# Patient Record
Sex: Female | Born: 1995 | Race: White | Hispanic: No | Marital: Single | Smoking: Never smoker
Health system: Southern US, Community
[De-identification: ages and names within clinical notes are randomized; demographics above are authoritative.]

## PROBLEM LIST (undated history)

## (undated) DIAGNOSIS — Z87898 Personal history of other specified conditions: Secondary | ICD-10-CM

## (undated) HISTORY — DX: Personal history of other specified conditions: Z87.898

---
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
## 2013-01-27 ENCOUNTER — Emergency Department: Payer: Self-pay | Admitting: Unknown Physician Specialty

## 2013-10-10 ENCOUNTER — Encounter: Payer: Self-pay | Admitting: Family Medicine

## 2013-10-10 ENCOUNTER — Other Ambulatory Visit: Payer: Self-pay | Admitting: *Deleted

## 2013-10-10 ENCOUNTER — Ambulatory Visit (INDEPENDENT_AMBULATORY_CARE_PROVIDER_SITE_OTHER): Payer: Managed Care, Other (non HMO) | Admitting: Family Medicine

## 2013-10-10 VITALS — BP 104/70 | HR 57 | Temp 98.2°F | Ht 70.0 in | Wt 157.8 lb

## 2013-10-10 DIAGNOSIS — Z23 Encounter for immunization: Secondary | ICD-10-CM

## 2013-10-10 DIAGNOSIS — R5381 Other malaise: Secondary | ICD-10-CM

## 2013-10-10 DIAGNOSIS — M2440 Recurrent dislocation, unspecified joint: Secondary | ICD-10-CM | POA: Insufficient documentation

## 2013-10-10 DIAGNOSIS — L659 Nonscarring hair loss, unspecified: Secondary | ICD-10-CM | POA: Insufficient documentation

## 2013-10-10 DIAGNOSIS — R5383 Other fatigue: Secondary | ICD-10-CM

## 2013-10-10 LAB — CBC WITH DIFFERENTIAL/PLATELET
Basophils Relative: 1 % (ref 0–1)
Eosinophils Relative: 2 % (ref 0–5)
HCT: 39.7 % (ref 36.0–46.0)
Hemoglobin: 13.7 g/dL (ref 12.0–15.0)
Lymphocytes Relative: 34 % (ref 12–46)
MCHC: 34.5 g/dL (ref 30.0–36.0)
MCV: 87.4 fL (ref 78.0–100.0)
Monocytes Absolute: 0.7 10*3/uL (ref 0.1–1.0)
Monocytes Relative: 9 % (ref 3–12)
Neutro Abs: 4 10*3/uL (ref 1.7–7.7)
RBC: 4.54 MIL/uL (ref 3.87–5.11)
RDW: 13.8 % (ref 11.5–15.5)

## 2013-10-10 LAB — COMPREHENSIVE METABOLIC PANEL
ALT: 13 U/L (ref 0–35)
AST: 18 U/L (ref 0–37)
BUN: 13 mg/dL (ref 6–23)
Calcium: 9.8 mg/dL (ref 8.4–10.5)
Chloride: 104 mEq/L (ref 96–112)
Creat: 0.8 mg/dL (ref 0.50–1.10)
Glucose, Bld: 83 mg/dL (ref 70–99)
Total Bilirubin: 0.4 mg/dL (ref 0.3–1.2)

## 2013-10-10 NOTE — Progress Notes (Signed)
Pre-visit discussion using our clinic review tool. No additional management support is needed unless otherwise documented below in the visit note.  

## 2013-10-10 NOTE — Patient Instructions (Signed)
Flu vaccine today  Here is some info on the HPV vaccine  Labs today for hair loss and fatigue Please send for last 2 office notes form Dr Brickner([facility] sport [facility]) Also Dr Wallace Cullens [facility] rheumatology)  Stay fit and eat a healthy diet

## 2013-10-10 NOTE — Progress Notes (Signed)
Subjective:    Patient ID: Carrie Daugherty, female    DOB: [birth year], 17 y.o.   MRN: [id number]  HPI Here to establish - goes to [employer]  She is interested in biopschych   Used to go to Dr Bard [facility]   Td 06  Thinks she is up to date on other shots  Has not had HPV vaccine   Is very healthy in general   ? If she could poss have a connective tissue disease  She has very "loose joints"  Also her hands never form callouses like they should (and she used to row crew)   Lost a lot of hair (is thinning )- since middle of the summer  Hair is not as thick as it used to be  No family hx of female hair loss  Has been more tired lately-even when sleeping well    (is also in exam time)   Is having regular menses  She lost a lot of muscle when she stopped rowing She was having joint dislocations - shoulders/ wrists Not overly flexible  She was referred to a rheumatoid specialist -did not think she had marfan's or "EDS" Did not do labs there    She likes to run a lot now - about 1-2 hours per day  Does not lift weights   Her scars tend to be large but not keloid   No family hx of any of this   She is a frequent headache sufferer- spring and fall - ? All related    There are no active problems to display for this patient.  Past Medical History  Diagnosis Date  . History of headache    No past surgical history on file. History  Substance Use Topics  . Smoking status: Never Smoker   . Smokeless tobacco: Not on file  . Alcohol Use: No   Family History  Problem Relation Age of Onset  . Alcohol abuse Other     paternal great grandfather  . Arthritis Father   . Arthritis Paternal Grandfather   . Heart disease Paternal Grandmother   . High blood pressure Mother   . High blood pressure Maternal Grandfather   . High blood pressure Maternal Grandmother    No Known Allergies No current outpatient prescriptions on file prior to visit.   No current facility-administered  medications on file prior to visit.     Review of Systems Review of Systems  Constitutional: Negative for fever, appetite change, fatigue and unexpected weight change.  Eyes: Negative for pain and visual disturbance.  Respiratory: Negative for cough and shortness of breath.   Cardiovascular: Negative for cp or palpitations    Gastrointestinal: Negative for nausea, diarrhea and constipation.  Genitourinary: Negative for urgency and frequency.  Skin: Negative for pallor or rash   MSK pos for occasional joint pain without swelling / pos for lax joints  Neurological: Negative for weakness, light-headedness, numbness and headaches.  Hematological: Negative for adenopathy. Does not bruise/bleed easily.  Psychiatric/Behavioral: Negative for dysphoric mood. The patient is not nervous/anxious.         Objective:   Physical Exam  Constitutional: She appears well-developed and well-nourished. No distress.  Tall stature  HENT:  Head: Normocephalic and atraumatic.  Right Ear: External ear normal.  Left Ear: External ear normal.  Mouth/Throat: Oropharynx is clear and moist.  Nares are clear but boggy  Eyes: Conjunctivae and EOM are normal. Pupils are equal, round, and reactive to light. Right eye  exhibits no discharge. Left eye exhibits no discharge. No scleral icterus.  Neck: Normal range of motion. Neck supple. No JVD present. No thyromegaly present.  Cardiovascular: Normal rate, regular rhythm, normal heart sounds and intact distal pulses.  Exam reveals no gallop.   Pulmonary/Chest: Effort normal and breath sounds normal. No respiratory distress. She has no wheezes. She has no rales.  Abdominal: Soft. Bowel sounds are normal. She exhibits no distension and no mass. There is no tenderness.  Musculoskeletal: She exhibits no edema and no tenderness.  No acute joint changes   Some cracking in hips/ knuckles and back   Lymphadenopathy:    She has no cervical adenopathy.  Neurological: She is  alert. She has normal reflexes. No cranial nerve deficit. She exhibits normal muscle tone. Coordination normal.  Skin: Skin is warm and dry. No rash noted. No erythema. No pallor.  No focal allopecia noted   Psychiatric: She has a normal mood and affect.          Assessment & Plan:

## 2013-10-11 NOTE — Assessment & Plan Note (Signed)
Lab today  This could be schedule and sleep related  Some stressors

## 2013-10-11 NOTE — Assessment & Plan Note (Signed)
Not focal  ? Stress related or other  tsh today

## 2013-10-11 NOTE — Assessment & Plan Note (Signed)
Will review rheum and sport med notes No problems currently- running does not cause pain or dislocations Per pt Marfans and EDS have been ruled out

## 2013-10-12 ENCOUNTER — Encounter: Payer: Self-pay | Admitting: *Deleted

## 2014-06-24 ENCOUNTER — Ambulatory Visit: Payer: Managed Care, Other (non HMO) | Admitting: Family Medicine

## 2014-11-12 ENCOUNTER — Ambulatory Visit (INDEPENDENT_AMBULATORY_CARE_PROVIDER_SITE_OTHER): Payer: Managed Care, Other (non HMO) | Admitting: Family Medicine

## 2014-11-12 ENCOUNTER — Encounter: Payer: Self-pay | Admitting: Family Medicine

## 2014-11-12 VITALS — BP 116/64 | HR 64 | Temp 98.8°F | Ht 70.0 in | Wt 156.5 lb

## 2014-11-12 DIAGNOSIS — B349 Viral infection, unspecified: Secondary | ICD-10-CM | POA: Insufficient documentation

## 2014-11-12 DIAGNOSIS — J029 Acute pharyngitis, unspecified: Secondary | ICD-10-CM

## 2014-11-12 LAB — POCT RAPID STREP A (OFFICE): Rapid Strep A Screen: NEGATIVE

## 2014-11-12 NOTE — Progress Notes (Signed)
Subjective:    Patient ID: Carrie BergerSarah Daugherty, female    DOB: Nov 25, 1995, 18 y.o.   MRN: [id number]  HPI Here with ST and fatigue   Started fatigue with finals week  Then got more and more tired  Bad ST - now is actually much better  Mild congestion -with a headache  Nausea- no vomiting   rst negative   No rash  No abd pain   No cough   No swelling of face or eyes   Sore in neck- where LN are  housemate has mono  Patient Active Problem List   Diagnosis Date Noted  . Hair loss 10/10/2013  . Other malaise and fatigue 10/10/2013  . Dislocation, joint, recurrent 10/10/2013   Past Medical History  Diagnosis Date  . History of headache    No past surgical history on file. History  Substance Use Topics  . Smoking status: Never Smoker   . Smokeless tobacco: Not on file  . Alcohol Use: No   Family History  Problem Relation Age of Onset  . Alcohol abuse Other     paternal great grandfather  . Arthritis Father   . Arthritis Paternal Grandfather   . Heart disease Paternal Grandmother   . High blood pressure Mother   . High blood pressure Maternal Grandfather   . High blood pressure Maternal Grandmother    No Known Allergies No current outpatient prescriptions on file prior to visit.   No current facility-administered medications on file prior to visit.      Review of Systems Review of Systems  Constitutional: Negative for fever, appetite change,  and unexpected weight change. pos for fatigue  ENT pos for cong/ post nasal drip / neg for ST (it has revolved) Eyes: Negative for pain and visual disturbance.  Respiratory: Negative for cough and shortness of breath.   Cardiovascular: Negative for cp or palpitations    Gastrointestinal: Negative for, diarrhea and constipation. pos for nausea that has resolved  Genitourinary: Negative for urgency and frequency.  Skin: Negative for pallor or rash   Neurological: Negative for weakness, light-headedness, numbness and  headaches.  Hematological: Negative for adenopathy. Does not bruise/bleed easily.  Psychiatric/Behavioral: Negative for dysphoric mood. The patient is not nervous/anxious.         Objective:   Physical Exam  Constitutional: She appears well-developed and well-nourished. No distress.  HENT:  Head: Normocephalic and atraumatic.  Right Ear: External ear normal.  Left Ear: External ear normal.  Mouth/Throat: Oropharynx is clear and moist. No oropharyngeal exudate.  Nares are boggy Clear rhinorrhea  Throat clear with some post nasal drip   No sinus tenderness  Eyes: Conjunctivae and EOM are normal. Pupils are equal, round, and reactive to light. Right eye exhibits no discharge. Left eye exhibits no discharge.  Neck: Normal range of motion. Neck supple.  No adenopathy at all  Cardiovascular: Normal rate, regular rhythm and normal heart sounds.   Pulmonary/Chest: Effort normal and breath sounds normal. No respiratory distress. She has no wheezes. She has no rales.  Abdominal: Soft. Bowel sounds are normal. She exhibits no distension. There is no tenderness.  No HSM  Musculoskeletal:  No acute joint changes  Lymphadenopathy:    She has no cervical adenopathy.  Skin: Skin is warm and dry. No rash noted.  Psychiatric: She has a normal mood and affect.          Assessment & Plan:   Problem List Items Addressed This Visit  Other   Viral syndrome    Overall much improved now  Results for orders placed or performed in visit on 11/12/14  Rapid Strep A  Result Value Ref Range   Rapid Strep A Screen Negative Negative    Exam not consistent with mono-no LN / throat is clear  Disc viral syndrome -offered test for mono on the off chance she had it and improved -she declined  Disc symptomatic care - see instructions on AVS  Update if not starting to improve in a week or if worsening       Other Visit Diagnoses    Sore throat    -  Primary    Relevant Orders       Rapid  Strep A (Completed)

## 2014-11-12 NOTE — Progress Notes (Signed)
Pre visit review using our clinic review tool, if applicable. No additional management support is needed unless otherwise documented below in the visit note. 

## 2014-11-12 NOTE — Assessment & Plan Note (Signed)
Overall much improved now  Results for orders placed or performed in visit on 11/12/14  Rapid Strep A  Result Value Ref Range   Rapid Strep A Screen Negative Negative    Exam not consistent with mono-no LN / throat is clear  Disc viral syndrome -offered test for mono on the off chance she had it and improved -she declined  Disc symptomatic care - see instructions on AVS  Update if not starting to improve in a week or if worsening

## 2014-11-12 NOTE — Patient Instructions (Signed)
Let me know if you want to see a dermatologist for hair loss Take care of yourself  There is a supplement called biotin that may hair health- you can get it at the drugstore  Drink lots of fluids and rest  Neg strep test today  Throat looks good and no lymph nodes - your exam does not indicate mono today (whether you may have had a mild case and are getting better is possible)    Take care of yourself

## 2017-07-14 ENCOUNTER — Encounter: Payer: Self-pay | Admitting: Family Medicine

## 2017-07-14 ENCOUNTER — Ambulatory Visit (INDEPENDENT_AMBULATORY_CARE_PROVIDER_SITE_OTHER): Payer: Managed Care, Other (non HMO) | Admitting: Family Medicine

## 2017-07-14 ENCOUNTER — Ambulatory Visit
Admission: RE | Admit: 2017-07-14 | Discharge: 2017-07-14 | Disposition: A | Discharge status: Home / Self Care | Payer: Managed Care, Other (non HMO) | Source: Ambulatory Visit | Attending: Family Medicine | Admitting: Family Medicine

## 2017-07-14 ENCOUNTER — Ambulatory Visit (INDEPENDENT_AMBULATORY_CARE_PROVIDER_SITE_OTHER)
Admission: RE | Admit: 2017-07-14 | Discharge: 2017-07-14 | Disposition: A | Discharge status: Home / Self Care | Payer: Managed Care, Other (non HMO) | Source: Ambulatory Visit | Attending: Family Medicine | Admitting: Family Medicine

## 2017-07-14 VITALS — BP 104/64 | HR 69 | Temp 98.6°F | Ht 70.5 in | Wt 151.8 lb

## 2017-07-14 DIAGNOSIS — M25562 Pain in left knee: Secondary | ICD-10-CM

## 2017-07-14 DIAGNOSIS — M357 Hypermobility syndrome: Secondary | ICD-10-CM

## 2017-07-14 DIAGNOSIS — M25561 Pain in right knee: Secondary | ICD-10-CM

## 2017-07-14 DIAGNOSIS — M222X2 Patellofemoral disorders, left knee: Secondary | ICD-10-CM

## 2017-07-14 DIAGNOSIS — M222X1 Patellofemoral disorders, right knee: Secondary | ICD-10-CM | POA: Diagnosis not present

## 2017-07-14 MED ORDER — DICLOFENAC SODIUM 75 MG PO TBEC: 75.0000 mg | DELAYED_RELEASE_TABLET | Freq: Two times a day (BID) | ORAL | 1 refills | Status: AC

## 2017-07-14 NOTE — Progress Notes (Signed)
Dr. Karleen HampshireSpencer T. Tona Qualley, MD, CAQ Sports Medicine Primary Care and Sports Medicine [address] [place], [id number] Phone: [phone] Fax: [phone]  07/14/2017  Patient: Carrie BergerSarah Daugherty, MRN: [id number], DOB: 1996-09-15, 21 y.o.  Primary Physician:  Tower, Audrie GallusMarne A, MD   Chief Complaint  Patient presents with  . Knee Pain    Bilateral-Started end of May on hiking trip   Subjective:   Carrie Daugherty is a 21 y.o. very pleasant female patient who presents with the following:  Consult: Dr. 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 She recently graduated from [employer] and now she works at [employer].  At the end of May she went on a 3 or 4 day hiking trip where she was hiking about 20 miles a day, and since then she has developed some anterior knee pain. This has improved since that time, but it has been persistent. Prior to this time, she had been running a lot and was straining for [employer] race.  At this time, she is having enough pain that she is unable to run. She did take some Motrin for about 4 weeks. Prior to this, she had no significant history of knee injury and has had no history of patellar dislocation.  Works at [employer], [employer].   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  Past Medical History, Surgical History, Social History, Family History, Problem List, Medications, and Allergies have been reviewed and updated if relevant.  Patient Active Problem List   Diagnosis Date Noted  . Viral syndrome 11/12/2014  . Hair loss 10/10/2013  . Other malaise and fatigue 10/10/2013  . Dislocation, joint, recurrent 10/10/2013    Past Medical History:  Diagnosis Date  . History of headache     No past surgical history on  file.  Social History   Social History  . Marital status: Single    Spouse name: N/A  . Number of children: N/A  . Years of education: N/A   Occupational History  . Not on file.   Social History Main Topics  . Smoking status: Never Smoker  . Smokeless tobacco: Never Used  . Alcohol use No  . Drug use: No  . Sexual activity: Not on file   Other Topics Concern  . Not on file   Social History Narrative  . No narrative on file    Family History  Problem Relation Age of Onset  . Alcohol abuse Other        paternal great grandfather  . Arthritis Father   . Arthritis Paternal Grandfather   . Heart disease Paternal Grandmother   . High blood pressure Mother   . High blood pressure Maternal Grandfather   . High blood pressure Maternal Grandmother     No Known Allergies  Medication list reviewed and updated in full in [facility].  GEN: No fevers, chills. Nontoxic. Primarily MSK c/o today. MSK: Detailed in the HPI GI: tolerating PO intake without difficulty Neuro: No numbness, parasthesias, or tingling associated. Otherwise the pertinent positives of the ROS are noted above.   Objective:   BP 104/64   Pulse 69   Temp 98.6 F (37 C) (Oral)   Ht 5' 10.5" (1.791 m)   Wt 151 lb 12  oz (68.8 kg)   LMP 06/20/2017   BMI 21.47 kg/m    GEN: WDWN, NAD, Non-toxic, Alert & Oriented x 3 HEENT: Atraumatic, Normocephalic.  Ears and Nose: No external deformity. EXTR: No clubbing/cyanosis/edema NEURO: Normal gait.  PSYCH: Normally interactive. Conversant. Not depressed or anxious appearing.  Calm demeanor.   Knee:  b Gait: Normal heel toe pattern ROM: hyperextensible to 10 deg, flexion to 140 Effusion: neg Echymosis or edema: none Patellar tendon NT Painful PLICA: neg Patellar grind: negative Medial and lateral patellar facet loading: mild pain Apprehension negative medial and lateral joint lines:NT Mcmurray's neg Flexion-pinch neg Varus and valgus stress:  stable Lachman: neg Ant and Post drawer: neg Hip abduction, IR, ER: WNL Hip flexion str: 5/5 Hip abd: 5/5 Quad: 4+/5 VMO atrophy: mild - moderate Hamstring concentric and eccentric: 5/5   Hyperextensible at all finger joints  HIP EXAM: SIDE: b ROM: Abduction, Flexion, Internal and External range of motion: full Pain with terminal IROM and EROM: no GTB: NT SLR: NEG Knees: No effusion FABER: NT REVERSE FABER: NT, neg Piriformis: NT at direct palpation Str: flexion: 5/5 abduction: 5/5 adduction: 5/5 Strength testing non-tender  Radiology: Dg Knee 4 Views W/patella Left  Result Date: 07/14/2017 CLINICAL DATA:  Bilateral patellar pain EXAM: LEFT KNEE - COMPLETE 4+ VIEW COMPARISON:  None. FINDINGS: No evidence of fracture, dislocation, or joint effusion. No evidence of arthropathy or other focal bone abnormality. Soft tissues are unremarkable. IMPRESSION: Negative. Electronically Signed   By: Charlett Nose M.D.   On: 07/14/2017 09:22   Dg Knee 4 Views W/patella Right  Result Date: 07/14/2017 CLINICAL DATA:  Bilateral patellar pain EXAM: RIGHT KNEE - COMPLETE 4+ VIEW COMPARISON:  None. FINDINGS: No evidence of fracture, dislocation, or joint effusion. No evidence of arthropathy or other focal bone abnormality. Soft tissues are unremarkable. IMPRESSION: Negative. Electronically Signed   By: Charlett Nose M.D.   On: 07/14/2017 09:22     Assessment and Plan:   Pain in both knees, unspecified chronicity - Plan: DG Knee 4 Views W/Patella Left, DG Knee 4 Views W/Patella Right, Ambulatory referral to Physical Therapy  Patellofemoral syndrome of both knees - Plan: Ambulatory referral to Physical Therapy  Hypermobility syndrome  My calculated Beighton score is 4/9. Multiple dislocations, multiple joints.  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  She has been worked up by [facility] previously.  She is having primarily anterior knee pain, consistent  with patellofemoral knee pain.  I suspect that her underlying hypermobility increases his risk, and she has not been doing any quadricep strengthening.  Her VMO is relatively flaccid.  I reviewed some rehabilitation with her myself.  Formal physical therapy.  Also recommended some Genutrain knee braces for patellar stability when she is running.  I appreciate the opportunity to evaluate this very friendly patient. If you have any question regarding her care or prognosis, do not hesitate to ask.   Cc: Dr. Milinda Antis  Follow-up: If continues with symptoms over 4-6 weeks of PT and rehab, f/u with me  Future Appointments Date Time Provider Department Center  07/25/2017 8:30 AM Tower, Audrie Gallus, MD [facility]    Meds ordered this encounter  Medications  . diclofenac (VOLTAREN) 75 MG EC tablet    Sig: Take 1 tablet (75 mg total) by mouth 2 (two) times daily.    Dispense:  60 tablet    Refill:  1   Orders Placed This Encounter  Procedures  .  DG Knee 4 Views W/Patella Left  . DG Knee 4 Views W/Patella Right  . Ambulatory referral to Physical Therapy    Signed,  Karleen Hampshire T. Isabell Bonafede, MD   Patient's Medications  New Prescriptions   DICLOFENAC (VOLTAREN) 75 MG EC TABLET    Take 1 tablet (75 mg total) by mouth 2 (two) times daily.  Previous Medications   No medications on file  Modified Medications   No medications on file  Discontinued Medications   No medications on file

## 2017-07-14 NOTE — Patient Instructions (Signed)

## 2017-07-16 ENCOUNTER — Encounter: Payer: Self-pay | Admitting: Family Medicine

## 2017-07-16 DIAGNOSIS — M357 Hypermobility syndrome: Secondary | ICD-10-CM | POA: Insufficient documentation

## 2017-07-25 ENCOUNTER — Ambulatory Visit (INDEPENDENT_AMBULATORY_CARE_PROVIDER_SITE_OTHER): Payer: Managed Care, Other (non HMO) | Admitting: Family Medicine

## 2017-07-25 ENCOUNTER — Encounter: Payer: Self-pay | Admitting: Family Medicine

## 2017-07-25 VITALS — BP 102/62 | HR 59 | Temp 98.3°F | Ht 70.5 in | Wt 150.5 lb

## 2017-07-25 DIAGNOSIS — Z0000 Encounter for general adult medical examination without abnormal findings: Secondary | ICD-10-CM | POA: Diagnosis not present

## 2017-07-25 DIAGNOSIS — Z23 Encounter for immunization: Secondary | ICD-10-CM

## 2017-07-25 LAB — LIPID PANEL
Cholesterol: 165 mg/dL (ref 0–200)
HDL: 57.6 mg/dL (ref >39.00)
LDL CALC: 98 mg/dL (ref 0–99)
NONHDL: 107.81
Total CHOL/HDL Ratio: 3
Triglycerides: 51 mg/dL (ref 0.0–149.0)
VLDL: 10.2 mg/dL (ref 0.0–40.0)

## 2017-07-25 LAB — COMPREHENSIVE METABOLIC PANEL
ALK PHOS: 34 U/L — AB (ref 39–117)
ALT: 18 U/L (ref 0–35)
AST: 18 U/L (ref 0–37)
Albumin: 4.7 g/dL (ref 3.5–5.2)
BUN: 12 mg/dL (ref 6–23)
CHLORIDE: 104 meq/L (ref 96–112)
CO2: 27 mEq/L (ref 19–32)
Calcium: 9.8 mg/dL (ref 8.4–10.5)
Creatinine, Ser: 0.81 mg/dL (ref 0.40–1.20)
GFR: 92.87 mL/min (ref >60.00)
GLUCOSE: 79 mg/dL (ref 70–99)
POTASSIUM: 4.5 meq/L (ref 3.5–5.1)
SODIUM: 139 meq/L (ref 135–145)
TOTAL PROTEIN: 7.1 g/dL (ref 6.0–8.3)
Total Bilirubin: 0.6 mg/dL (ref 0.2–1.2)

## 2017-07-25 LAB — CBC WITH DIFFERENTIAL/PLATELET
BASOS PCT: 1.3 % (ref 0.0–3.0)
Basophils Absolute: 0.1 10*3/uL (ref 0.0–0.1)
Eosinophils Absolute: 0.1 10*3/uL (ref 0.0–0.7)
Eosinophils Relative: 1.7 % (ref 0.0–5.0)
HCT: 41.2 % (ref 36.0–46.0)
Hemoglobin: 13.6 g/dL (ref 12.0–15.0)
LYMPHS ABS: 1.9 10*3/uL (ref 0.7–4.0)
Lymphocytes Relative: 35.9 % (ref 12.0–46.0)
MCHC: 33 g/dL (ref 30.0–36.0)
MCV: 90.8 fl (ref 78.0–100.0)
MONO ABS: 0.4 10*3/uL (ref 0.1–1.0)
MONOS PCT: 7.9 % (ref 3.0–12.0)
NEUTROS ABS: 2.9 10*3/uL (ref 1.4–7.7)
NEUTROS PCT: 53.2 % (ref 43.0–77.0)
PLATELETS: 272 10*3/uL (ref 150.0–400.0)
RBC: 4.54 Mil/uL (ref 3.87–5.11)
RDW: 12.8 % (ref 11.5–15.5)
WBC: 5.4 10*3/uL (ref 4.0–10.5)

## 2017-07-25 LAB — TSH: TSH: 2.12 u[IU]/mL (ref 0.35–4.50)

## 2017-07-25 NOTE — Patient Instructions (Signed)
Flu shot today  Tdap vaccine today  Here is info on the HPV vaccine - it is a good idea to start the series   Labs today  Keep exercising and eating a healthy diet   Take care of yourself   Good luck !

## 2017-07-25 NOTE — Progress Notes (Signed)
Subjective:    Patient ID: Carrie Daugherty, female    DOB: 1996/09/05, 21 y.o.   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) Still looking for a place   Wt Readings from Last 3 Encounters:  07/25/17 150 lb 8 oz (68.3 kg)  07/14/17 151 lb 12 oz (68.8 kg)  11/12/14 156 lb 8 oz (71 kg)   21.29 kg/m  Wants a flu shot today  HPV vaccine   HIV screen -not interested   Pap test- has not had a pap yet /has not been sexually active and not in a relationship  She is not interested in a pap or gyn exam   Tetanus shot 7/06- due for -will get today  Wants to get labs today  She used to donate blood - and was turned down due to low HB  Periods are getting heavier recently  Regular  Last 6-7 days  Overall not tremendously heavy - not clotting    interested in wellness labs today  Exercise - saw Dr Patsy Lageropland for knees recently / doing PT  Loves to run/ does races  Diet is pretty healthy   Patient Active Problem List   Diagnosis Date Noted  . Routine general medical examination at a health care facility 07/25/2017  . Hypermobility syndrome 07/16/2017  . Hair loss 10/10/2013  . Other malaise and fatigue 10/10/2013  . Dislocation, joint, recurrent 10/10/2013   Past Medical History:  Diagnosis Date  . History of headache    No past surgical history on file. Social History  Substance Use Topics  . Smoking status: Never Smoker  . Smokeless tobacco: Never Used  . Alcohol use No   Family History  Problem Relation Age of Onset  . Alcohol abuse Other        paternal great grandfather  . Arthritis Father   . Arthritis Paternal Grandfather   . Heart disease Paternal Grandmother   . High blood pressure Mother   . High blood pressure Maternal Grandfather   . High blood pressure Maternal Grandmother    No Known Allergies Current Outpatient  Prescriptions on File Prior to Visit  Medication Sig Dispense Refill  . diclofenac (VOLTAREN) 75 MG EC tablet Take 1 tablet (75 mg total) by mouth 2 (two) times daily. 60 tablet 1   No current facility-administered medications on file prior to visit.     Review of Systems  Constitutional: Negative for activity change, appetite change, fatigue, fever and unexpected weight change.  HENT: Negative for congestion, ear pain, rhinorrhea, sinus pressure and sore throat.   Eyes: Negative for pain, redness and visual disturbance.  Respiratory: Negative for cough, shortness of breath and wheezing.   Cardiovascular: Negative for chest pain and palpitations.  Gastrointestinal: Negative for abdominal pain, blood in stool, constipation and diarrhea.  Endocrine: Negative for polydipsia and polyuria.  Genitourinary: Negative for dysuria, frequency and urgency.  Musculoskeletal: Negative for arthralgias, back pain and myalgias.  Skin: Negative for pallor and rash.  Allergic/Immunologic: Negative for environmental allergies.  Neurological: Negative for dizziness, syncope and headaches.  Hematological: Negative for adenopathy. Does not bruise/bleed easily.  Psychiatric/Behavioral: Negative for decreased concentration and dysphoric mood. The patient is not nervous/anxious.        Objective:   Physical Exam  Constitutional: She appears well-developed and well-nourished. No distress.  Slim and well appearing  HENT:  Head: Normocephalic and atraumatic.  Right Ear: External ear normal.  Left Ear: External ear normal.  Mouth/Throat: Oropharynx is clear and moist.  Eyes: Pupils are equal, round, and reactive to light. Conjunctivae and EOM are normal. No scleral icterus.  Neck: Normal range of motion. Neck supple. No JVD present. Carotid bruit is not present. No thyromegaly present.  Cardiovascular: Normal rate, regular rhythm, normal heart sounds and intact distal pulses.  Exam reveals no gallop.     Pulmonary/Chest: Effort normal and breath sounds normal. No respiratory distress. She has no wheezes. She exhibits no tenderness.  Abdominal: Soft. Bowel sounds are normal. She exhibits no distension, no abdominal bruit and no mass. There is no tenderness.  Genitourinary: No breast swelling, tenderness, discharge or bleeding.  Genitourinary Comments: Breast exam: No mass, nodules, thickening, tenderness, bulging, retraction, inflamation, nipple discharge or skin changes noted.  No axillary or clavicular LA.      Musculoskeletal: Normal range of motion. She exhibits no edema or tenderness.  Lymphadenopathy:    She has no cervical adenopathy.  Neurological: She is alert. She has normal reflexes. No cranial nerve deficit. She exhibits normal muscle tone. Coordination normal.  Skin: Skin is warm and dry. No rash noted. No erythema. No pallor.  Some b9 app brown nevi on back and trunk  Psychiatric: She has a normal mood and affect.          Assessment & Plan:   Problem List Items Addressed This Visit      Other   Routine general medical examination at a health care facility    Reviewed health habits including diet and exercise and skin cancer prevention Reviewed appropriate screening tests for age  Also reviewed health mt list, fam hx and immunization status , as well as social and family history   Labs for wellness today  See HPI Never sexually active - she declines gyn/pap at this time  Flu shot today  Tdap today  Info given on HPV vaccine- pt may get this after she moves        Relevant Orders   CBC with Differential/Platelet (Completed)   Comprehensive metabolic panel (Completed)   Lipid panel (Completed)   TSH (Completed)    Other Visit Diagnoses    Need for influenza vaccination    -  Primary   Relevant Orders   Flu Vaccine QUAD 6+ mos PF IM (Fluarix Quad PF) (Completed)   Need for Tdap vaccination       Relevant Orders   Tdap vaccine greater than or equal to 7yo IM  (Completed)

## 2017-07-25 NOTE — Assessment & Plan Note (Signed)
Reviewed health habits including diet and exercise and skin cancer prevention Reviewed appropriate screening tests for age  Also reviewed health mt list, fam hx and immunization status , as well as social and family history   Labs for wellness today  See HPI Never sexually active - she declines gyn/pap at this time  Flu shot today  Tdap today  Info given on HPV vaccine- pt may get this after she moves

## 2017-07-26 ENCOUNTER — Encounter: Payer: Self-pay | Admitting: *Deleted

## 2018-01-17 ENCOUNTER — Encounter (INDEPENDENT_AMBULATORY_CARE_PROVIDER_SITE_OTHER): Payer: Self-pay | Admitting: Neurology

## 2018-01-17 ENCOUNTER — Ambulatory Visit (INDEPENDENT_AMBULATORY_CARE_PROVIDER_SITE_OTHER): Payer: Commercial Managed Care - POS | Admitting: Neurology

## 2018-01-17 VITALS — BP 131/83 | HR 76 | Ht 70.5 in | Wt 148.0 lb

## 2018-01-17 DIAGNOSIS — R42 Dizziness and giddiness: Secondary | ICD-10-CM

## 2018-01-17 DIAGNOSIS — R4020 Unspecified coma: Secondary | ICD-10-CM

## 2018-01-17 NOTE — Progress Notes (Signed)
[email or web address]    Subjective:      CC: Dizziness and LOC    22 y.o. F presenting to neurology clinic For dizziness and loss of consciousness.  Patient states that in October 2018, she was walking for about 5 minutes and then she suddenly felt dizzy and nauseated and soon there after passed out.  Apparently, she seemed somewhat confused to her friend prior to passing out.  No witnessed shaking seizure-like activity or possible seizure.  No loss of bowel or bladder control.  Since that time, she still been having occasional episodes of dizziness.  Denies room spinning.  Evaluation with ENT was unremarkable.  She did have cardiac evaluation, losing a Holter monitor.  This was again mostly unremarkable, other than she does have some sinus tachycardia during some of the dizzy episodes.  She does feel that her heart beat is"off intermittently" around the dizziness episodes    She is not sure if she hit her head, but she has had a concussion in the past.  She does have occasional headaches, usually be a short, sharp pain that does not last long.  No migraines.  Additionally, the last few months.  She feels like her balance is off, denies any other falls.  No vision changes, blurry vision, double vision.  No hearing loss or tinnitus.  No difficulty swallowing.    She does feel like the dizziness is getting somewhat better.  Not happening as often.  She also feels that she is getting short of breath when she is exercising, even though she was always a fairly good athlete and in good shape.    Patient denies smoking, and drug use.  No contributory family history.               The following portions of the patient's history were reviewed and updated as appropriate: allergies, current medications, past family history, past medical history, past social history, past surgical history and problem list.    Review of Systems  Per HPI.  No recent illness.  Denies fever, chills, cough, sinus pain, eye pain, eye  redness, ear pain, rhinorrhea, sore throat, chest pain, SOB, wheezing, abd pain, Nausea, Vomiting, diarrhea, constipation, dysuria, or rashes.  All other ROS negative.       Objective:     BP 131/83   Pulse 76   Ht 1.791 m (5' 10.5")   Wt 67.1 kg (148 lb)   BMI 20.94 kg/m       Sitting: 135/84, pulse-57  Standing: 130/65, pulse -89    Constitutional: NAD   Eyes: Clear conjunctiva  Neck: Nontender, full ROM, No Lhermitte's sign  Cardiovascular: RRR  Resipratory: CTA B   Musculoskeletal: Normal muscle bulk.  No muscle pain  Integumentary: No abnormal rash noted  Psych: Normal affect    Neurological:   Mental Status: Alert & oriented to person, place, month, & year.   Language: Spontaneous & fluent speech, good comprehension.    Cranial Nerves:  II, III: PERRL, VFF  III, IV, VI: EOMI, No nystagmus, no palsies, no ptosis  V: Intact to LT V1-V3 distribution bilaterally.   VII: Symmetrical face and expression.   VIII: Hearing intact to finger rub bilaterally.   IX, X: Palate/Uvula elevates symmetrically.   XI: 5/5 Trapezius & SCM bilaterally.   XII: Tongue is midline.     Motor Exam: Normal bulk and tone.  No pronator drift. Strength 5/5 throughout and symmetric.    Sensation: Sensation to LT intact grossly throughout  symmetrically. Romberg negative    Cerebellar:   Finger to Nose: intact bilaterally w/ no dysmetria or tremor    Reflexes: 2+ throughout, symmetric, with down-going toes. No clonus.    Station/ Gait: Well balanced and stable. Normal foot width. Able to perform heels, toes, and tandem gait. Normal arm swing.      Assessment:     Today, her exam was nonfocal.  The initial fall in October, 2018 sided syncopal in nature, but given the mild confusion around the time of the fall.  We will get an EEG to rule out seizures.  I reviewed the images of her MRI brain, they are unremarkable with no lesions, masses, bleeds or anything also explain her dizziness or instability.  It is certainly possible that she had a  syncopal episode, and that this increase in dizziness and instability could be related to a concussion sustained at the time, as it seems to be improving now.    Plan:     EEG  Carotid ultrasound  Lab work    RTC in 2 months. . Patient can follow up sooner if needed. In the meantime, patient will contact the office with any questions or concerns.     -----------------------------------------------------------    Additional notes and data scanned including patient questionnaire which may contain pertinent information to visit.         Kateri Plummer, MD, PhD  Co-Director  Movement Disorders Specialist  [facility]  [facility]    [address]  [address]  [place] [address]    [place], [place] [address]  T [phone]  F [phone]   T [phone]  F [phone]     [email or web address]

## 2018-01-19 ENCOUNTER — Ambulatory Visit (INDEPENDENT_AMBULATORY_CARE_PROVIDER_SITE_OTHER): Payer: Commercial Managed Care - POS | Admitting: Vascular Neurology

## 2018-01-19 ENCOUNTER — Ambulatory Visit (INDEPENDENT_AMBULATORY_CARE_PROVIDER_SITE_OTHER): Payer: Commercial Managed Care - POS

## 2018-01-19 DIAGNOSIS — R4020 Unspecified coma: Secondary | ICD-10-CM

## 2018-01-19 DIAGNOSIS — R42 Dizziness and giddiness: Secondary | ICD-10-CM

## 2018-01-19 NOTE — Progress Notes (Signed)
Tech Report   EEG No: MR#  [id number]  Hrs of sleep: 7  Dominance: right   Last Meal: am  Caffeine:  No  Previous EEG: No  Ms. Caban currently has no medications in their medication list.   History: Dizziness/ LOC 08/2017  Description: HV and PS, Awake  Alert  Activity Noted:  10 hz background, LVF anteriorly, limited EKG rhythm strip using 2 electrodes show a regular rhythm, intermittent photic stimulation produced bilateral driving responses, hyperventilation for 3 minutes with good effort was without alteration of background  See Times: 08:39, 08;48, 08:55 Technologist: Oliva Bustard charges

## 2018-01-30 NOTE — Progress Notes (Signed)
Bilateral Carotid study done  See chart for results.

## 2018-02-03 NOTE — Procedures (Signed)
PROCEDURE NOTE    [facility]    Procedures  EEG report    Date of study: January 19, 2018    Indication: Dizziness, loss of consciousness.    Summary of findings:    The background tracing consisted of 40-60 V activity in the 10-11 Hz alpha range, which attenuated with eye-opening.  Drowsiness was seen with mild slowing of the background, and slower eye movements.  Stage II sleep was not clearly seen.  Occasional sharp transients were seen, not unequivocally epileptiform typical myogenic and electrode artifacts were seen.  Occasionally the myogenic artifact was more diffuse and obscured parts of the tracing.  Intermittent photic stimulation did not produce a clear driving response.  Hyperventilation did not produce clear slowing of the background.  No definite discharges or seizures were seen.  EKG showed a regular rhythm.    Impression:    Overall unremarkable waking and drowsy recording, with technical artifacts as noted above.  No definite discharges or seizures seen, though, negative EEG would not entirely excluded the possibility of seizures.  Correlation recommended.    Electronically Signed: Mart Piggs, MD

## 2018-03-08 ENCOUNTER — Ambulatory Visit (INDEPENDENT_AMBULATORY_CARE_PROVIDER_SITE_OTHER): Payer: Commercial Managed Care - POS | Admitting: Neurology

## 2018-05-04 ENCOUNTER — Other Ambulatory Visit (INDEPENDENT_AMBULATORY_CARE_PROVIDER_SITE_OTHER): Payer: Self-pay | Admitting: Neurology

## 2018-05-05 LAB — LYME AB, TOTAL,REFLEX TO WESTERN BLOT (IGG & IGM): Lyme Disease AB Screen: <0.9 index

## 2018-05-05 LAB — VITAMIN D,25 OH,TOTAL: Vitamin D, 25 OH, Total: 37 ng/mL (ref 30–100)

## 2018-05-05 LAB — TREPONEMA PALLIDUM (SYPHILIS) ANTIBODY: RPR Screen: NONREACTIVE

## 2018-05-05 LAB — TSH: TSH: 2.27 mIU/L

## 2018-05-05 LAB — MAGNESIUM: Magnesium: 2.1 mg/dL (ref 1.5–2.5)

## 2018-05-05 LAB — VITAMIN B12: Vitamin B-12: 803 pg/mL (ref 200–1100)

## 2018-06-01 ENCOUNTER — Encounter (INDEPENDENT_AMBULATORY_CARE_PROVIDER_SITE_OTHER): Payer: Self-pay

## 2018-09-04 ENCOUNTER — Encounter (INDEPENDENT_AMBULATORY_CARE_PROVIDER_SITE_OTHER): Payer: Self-pay | Admitting: Neurology

## 2018-09-04 ENCOUNTER — Ambulatory Visit (INDEPENDENT_AMBULATORY_CARE_PROVIDER_SITE_OTHER): Payer: Commercial Managed Care - POS | Admitting: Neurology

## 2018-09-04 VITALS — BP 136/82 | HR 73 | Ht 70.5 in | Wt 148.0 lb

## 2018-09-04 DIAGNOSIS — R42 Dizziness and giddiness: Secondary | ICD-10-CM

## 2018-09-04 DIAGNOSIS — I6522 Occlusion and stenosis of left carotid artery: Secondary | ICD-10-CM

## 2018-09-04 DIAGNOSIS — R4020 Unspecified coma: Secondary | ICD-10-CM

## 2018-09-04 NOTE — Progress Notes (Signed)
[facility]    Subjective:      CC: Dizziness and LOC    22 y.o. F presenting to neurology clinic For dizziness and loss of consciousness.  Patient states that she is doing fairly well.  Still can feel mildly dizzy from time to time.  Although, she still thinks is getting better.  Somewhat dizzy with standing, but no feeling like she might pass out again.  Sometimes can feel a bit unsteady with walking and might stumble to the side.  There is intermittent and there are other times she does not notice anything.  No further falls and no hitting her head.  Denies any headaches.  No vision loss blurry vision or double vision.  No face or leg numbness or weakness. no Vertigo.    Patient denies smoking, and drug use.  No contributory family history.     Retained from initial consult:   Patient states that in October 2018, she was walking for about 5 minutes and then she suddenly felt dizzy and nauseated and soon there after passed out.  Apparently, she seemed somewhat confused to her friend prior to passing out.  No witnessed shaking seizure-like activity or possible seizure.  No loss of bowel or bladder control.  Since that time, she still been having occasional episodes of dizziness.  Denies room spinning.  Evaluation with ENT was unremarkable.  She did have cardiac evaluation, losing a Holter monitor.  This was again mostly unremarkable, other than she does have some sinus tachycardia during some of the dizzy episodes.  She does feel that her heart beat is"off intermittently" around the dizziness episodes    She is not sure if she hit her head, but she has had a concussion in the past.  She does have occasional headaches, usually be a short, sharp pain that does not last long.  No migraines.  Additionally, the last few months.  She feels like her balance is off, denies any other falls.  No vision changes, blurry vision, double vision.  No hearing loss or tinnitus.  No difficulty swallowing.     She does feel like the dizziness is getting somewhat better.  Not happening as often.  She also feels that she is getting short of breath when she is exercising, even though she was always a fairly good athlete and in good shape.                  The following portions of the patient's history were reviewed and updated as appropriate: allergies, current medications, past family history, past medical history, past social history, past surgical history and problem list.    Review of Systems  Per HPI.  No recent illness.  Denies fever, chills, cough, sinus pain, eye pain, eye redness, ear pain, rhinorrhea, sore throat, chest pain, SOB, wheezing, abd pain, Nausea, Vomiting, diarrhea, constipation, dysuria, or rashes.  All other ROS negative.       Objective:     Ht 1.791 m (5' 10.5")   Wt 67.1 kg (148 lb)   BMI 20.94 kg/m       Sitting: 135/84, pulse-57  Standing: 130/65, pulse -89    Constitutional: NAD   Eyes: Clear conjunctiva  Neck: Nontender, full ROM, No Lhermitte's sign  Cardiovascular: RRR  Resipratory: CTA B   Musculoskeletal: Normal muscle bulk.  No muscle pain  Integumentary: No abnormal rash noted  Psych: Normal affect    Neurological:   Mental Status: Alert & oriented to person, place, month, &  year.   Language: Spontaneous & fluent speech, good comprehension.    Cranial Nerves:  II, III: PERRL, VFF  III, IV, VI: EOMI, No nystagmus, no palsies, no ptosis  V: Intact to LT V1-V3 distribution bilaterally.   VII: Symmetrical face and expression.   VIII: Hearing intact to finger rub bilaterally.   IX, X: Palate/Uvula elevates symmetrically.   XI: 5/5 Trapezius & SCM bilaterally.   XII: Tongue is midline.     Motor Exam: Normal bulk and tone.  No pronator drift. Strength 5/5 throughout and symmetric.    Sensation: Sensation to LT intact grossly throughout symmetrically. Romberg negative    Cerebellar:   Finger to Nose: intact bilaterally w/ no dysmetria or tremor    Reflexes: 2+ throughout, symmetric, with  down-going toes. No clonus.    Station/ Gait: Well balanced and stable. Normal foot width. Able to perform heels, toes, and tandem gait. Normal arm swing.      Assessment:     As noted previously, her NEURO exam is nonfocal.  The initial fall in October, 2018 certainly sounded syncopal in nature, but but she did have some mild confusion around the time of the fall.  To that end, EEG was unremarkable.  No other symptoms suggestive of seizure.  MRI brain was unremarkable.  Lab work was unremarkable.  Carotid ultrasound did show perhaps some carotid stenosis in her left carotid artery.  This would be very atypical given her age and lack of risk factors.  It is perhaps artifact, but would be concerning for her to have carotid stenosis at this point.  We will get CTA of her neck to better evaluate    Also, as noted previously, I reviewed the images of her MRI brain, they are unremarkable with no lesions, masses, bleeds or anything also explain her dizziness or instability.  It is certainly possible that she had a syncopal episode, and that this increase in dizziness and instability could be related to a concussion sustained at the time, as it seems to be gradually improving now.    Of note, some of this could also be related to low blood pressure.  She states that work-up with cardiology was unremarkable.    Plan:     CT angiogram of her neck to evaluate for possible left carotid stenosis    RTC in 2 months. . Patient can follow up sooner if needed. In the meantime, patient will contact the office with any questions or concerns.     -----------------------------------------------------------    Additional notes and data scanned including patient questionnaire which may contain pertinent information to visit.         Kateri Plummer, MD, PhD  Co-Director  Movement Disorders Specialist  [facility]  [facility]    [address]  [address]   [place] [address]    [place], [place] [address]  T [phone]  F [phone]   T [phone]  F [phone]     [email or web address]

## 2018-09-06 ENCOUNTER — Encounter (INDEPENDENT_AMBULATORY_CARE_PROVIDER_SITE_OTHER): Payer: Self-pay | Admitting: Neurology

## 2018-09-19 ENCOUNTER — Encounter (INDEPENDENT_AMBULATORY_CARE_PROVIDER_SITE_OTHER): Payer: Self-pay

## 2018-12-07 IMAGING — DX DG KNEE COMPLETE 4+V*L*
4 series · 4 of 4 positions shown · non-contrast
Comparison: None.

CLINICAL DATA: Bilateral patellar pain

EXAM:
LEFT KNEE - COMPLETE 4+ VIEW

[knee ap]
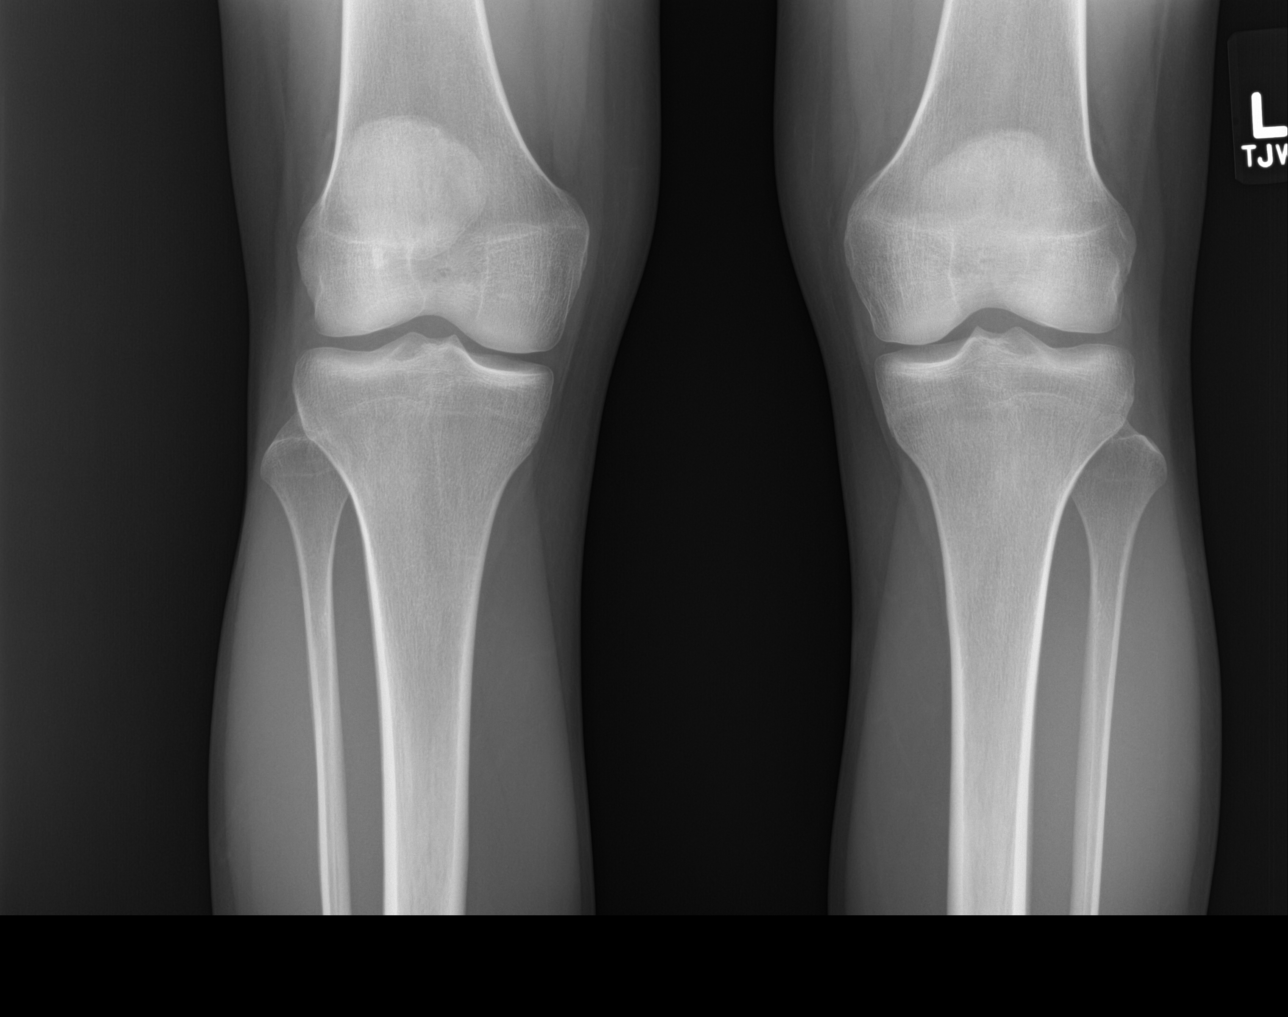

[knee lat]
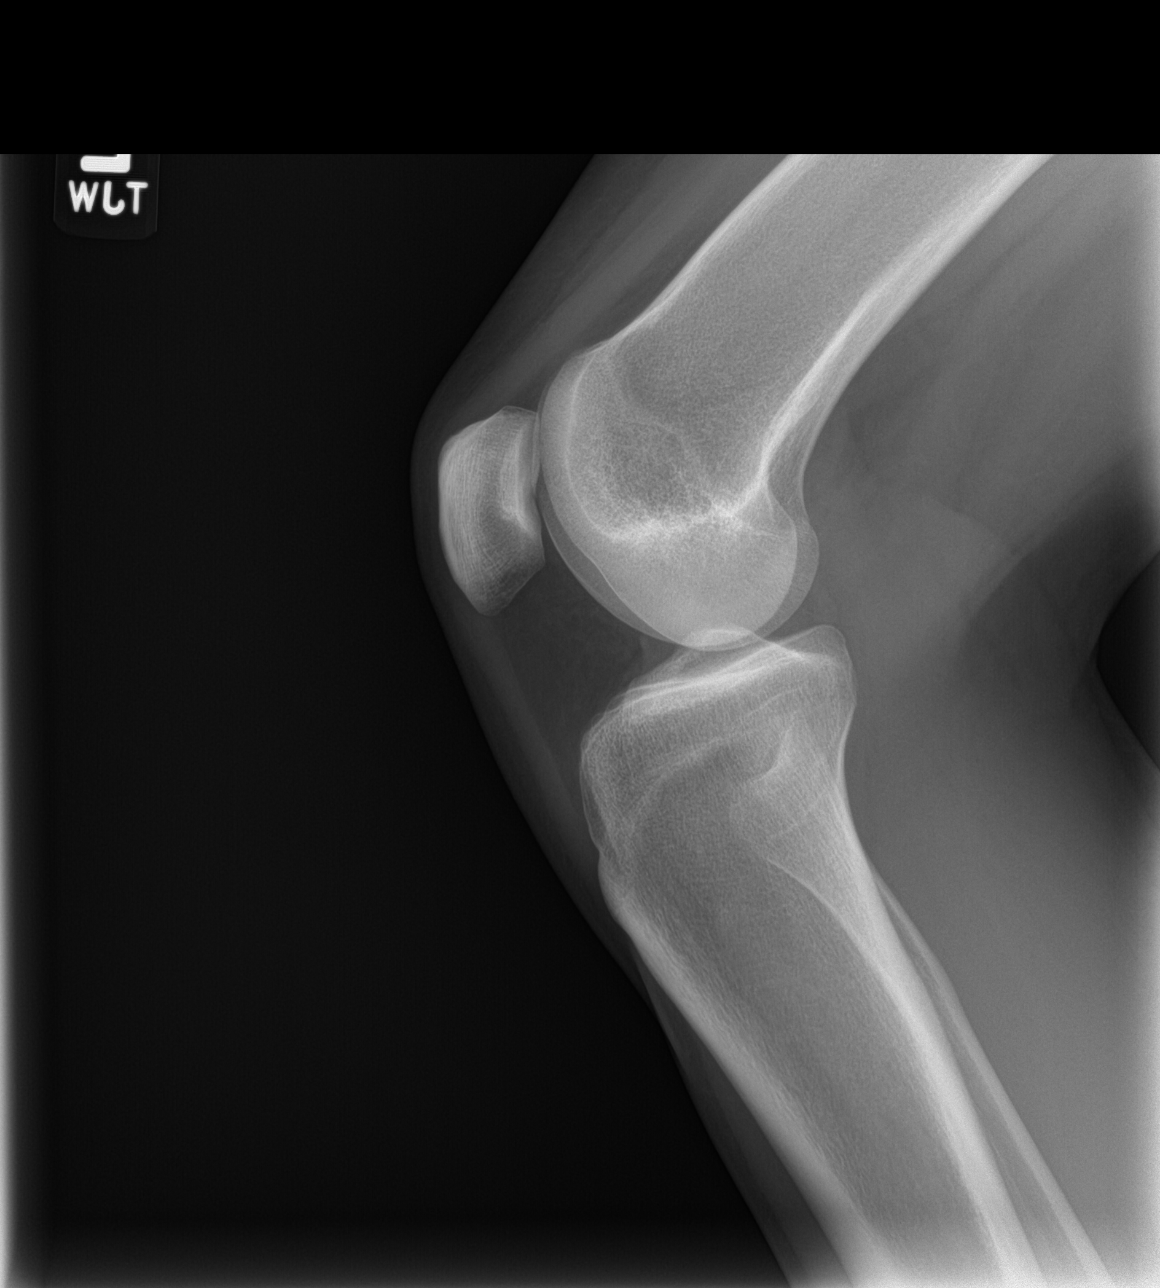

[patella skyline]
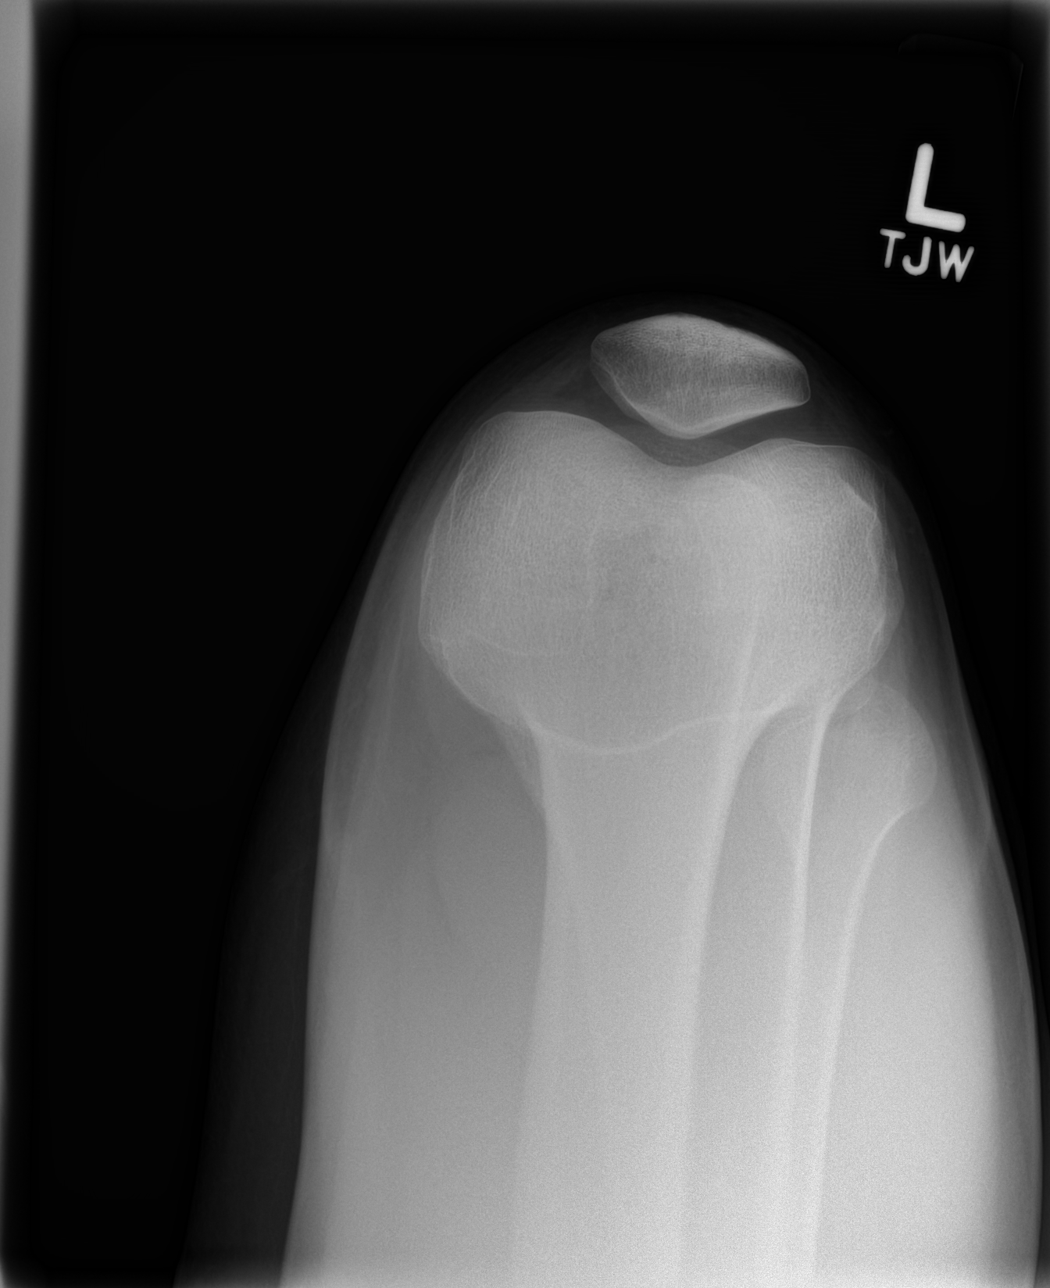

[knee obl]
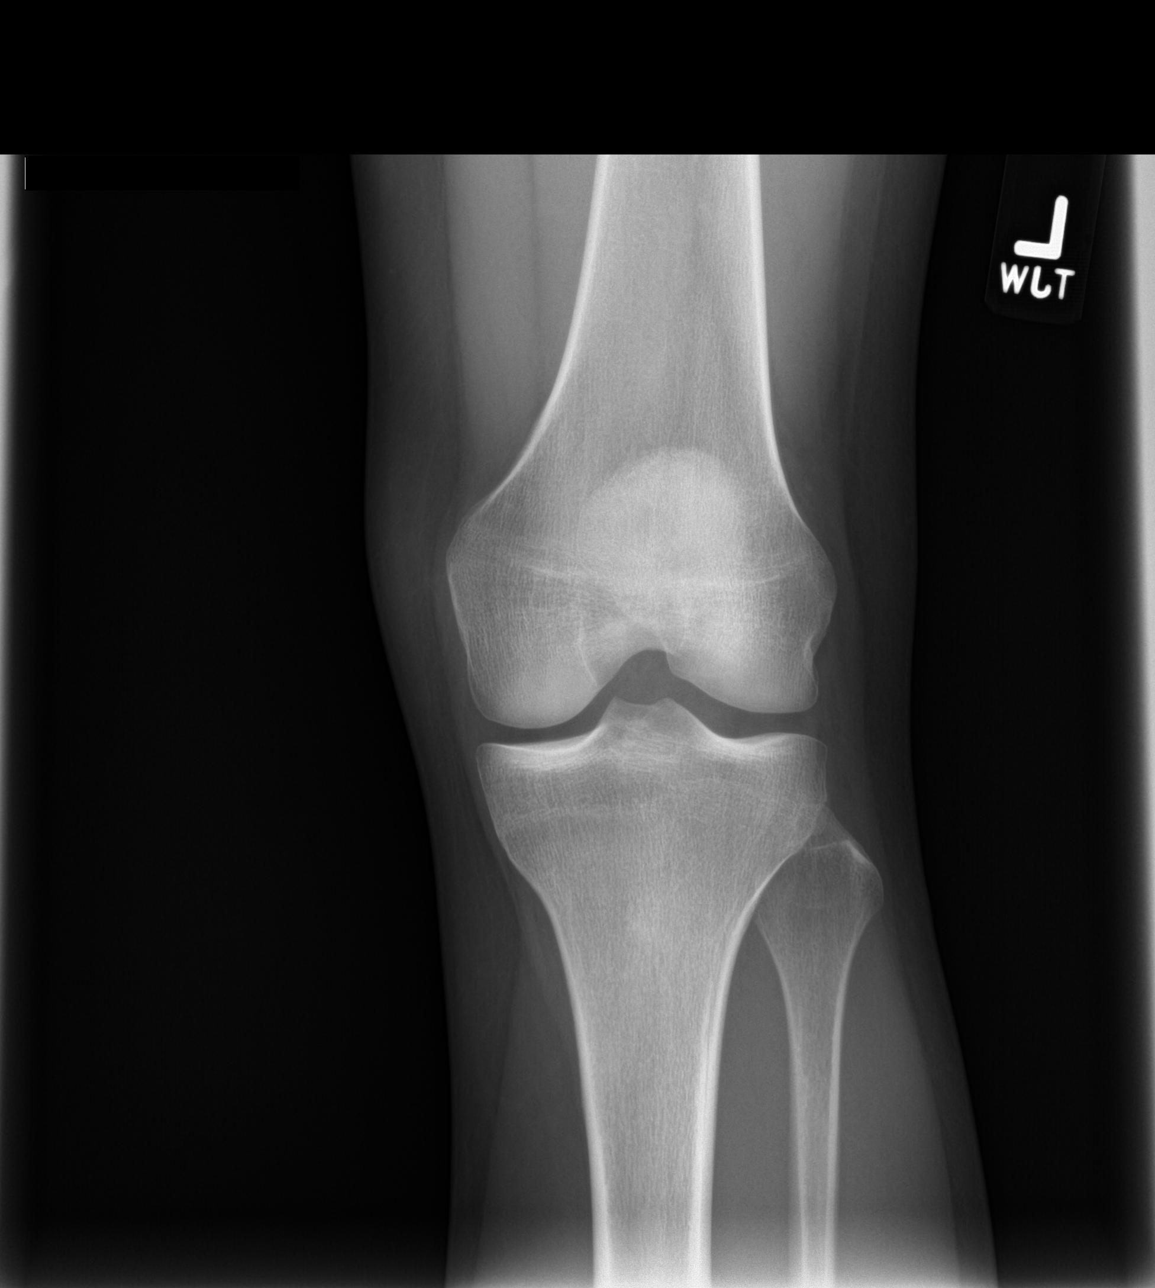

[4 of 4 positions shown; findings below may reference images not displayed]

FINDINGS: No evidence of fracture, dislocation, or joint effusion. No evidence
of arthropathy or other focal bone abnormality. Soft tissues are
unremarkable.
IMPRESSION: Negative.

## 2022-07-01 ENCOUNTER — Emergency Department
Admission: EM | Admit: 2022-07-01 | Discharge: 2022-07-01 | Disposition: A | Discharge status: Home / Self Care | Payer: No Typology Code available for payment source | Attending: Emergency Medicine | Admitting: Emergency Medicine

## 2022-07-01 ENCOUNTER — Emergency Department: Payer: No Typology Code available for payment source

## 2022-07-01 DIAGNOSIS — O034 Incomplete spontaneous abortion without complication: Secondary | ICD-10-CM | POA: Insufficient documentation

## 2022-07-01 DIAGNOSIS — O039 Complete or unspecified spontaneous abortion without complication: Secondary | ICD-10-CM

## 2022-07-01 LAB — CBC AND DIFFERENTIAL
Absolute NRBC: 0 10*3/uL (ref 0.00–0.00)
Basophils Absolute Automated: 0.09 10*3/uL — ABNORMAL HIGH (ref 0.00–0.08)
Basophils Automated: 0.7 %
Eosinophils Absolute Automated: 0.09 10*3/uL (ref 0.00–0.44)
Eosinophils Automated: 0.7 %
Hematocrit: 43.7 % (ref 34.7–43.7)
Hgb: 14.7 g/dL (ref 11.4–14.8)
Immature Granulocytes Absolute: 0.05 10*3/uL (ref 0.00–0.07)
Immature Granulocytes: 0.4 %
Instrument Absolute Neutrophil Count: 9.31 10*3/uL — ABNORMAL HIGH (ref 1.10–6.33)
Lymphocytes Absolute Automated: 2.44 10*3/uL (ref 0.42–3.22)
Lymphocytes Automated: 18.6 %
MCH: 29.1 pg (ref 25.1–33.5)
MCHC: 33.6 g/dL (ref 31.5–35.8)
MCV: 86.4 fL (ref 78.0–96.0)
MPV: 8.2 fL — ABNORMAL LOW (ref 8.9–12.5)
Monocytes Absolute Automated: 1.13 10*3/uL — ABNORMAL HIGH (ref 0.21–0.85)
Monocytes: 8.6 %
Neutrophils Absolute: 9.31 10*3/uL — ABNORMAL HIGH (ref 1.10–6.33)
Neutrophils: 71 %
Nucleated RBC: 0 /100 WBC (ref 0.0–0.0)
Platelets: 344 10*3/uL (ref 142–346)
RBC: 5.06 10*6/uL (ref 3.90–5.10)
RDW: 13 % (ref 11–15)
WBC: 13.11 10*3/uL — ABNORMAL HIGH (ref 3.10–9.50)

## 2022-07-01 LAB — COMPREHENSIVE METABOLIC PANEL
ALT: 25 U/L (ref 0–55)
AST (SGOT): 23 U/L (ref 5–41)
Albumin/Globulin Ratio: 1.5 (ref 0.9–2.2)
Albumin: 5 g/dL (ref 3.5–5.0)
Alkaline Phosphatase: 48 U/L (ref 37–117)
Anion Gap: 11 (ref 5.0–15.0)
BUN: 11 mg/dL (ref 7.0–21.0)
Bilirubin, Total: 0.9 mg/dL (ref 0.2–1.2)
CO2: 23 mEq/L (ref 17–29)
Calcium: 10 mg/dL (ref 8.5–10.5)
Chloride: 107 mEq/L (ref 99–111)
Creatinine: 0.8 mg/dL (ref 0.4–1.0)
Globulin: 3.4 g/dL (ref 2.0–3.6)
Glucose: 92 mg/dL (ref 70–100)
Potassium: 3.9 mEq/L (ref 3.5–5.3)
Protein, Total: 8.4 g/dL — ABNORMAL HIGH (ref 6.0–8.3)
Sodium: 141 mEq/L (ref 135–145)
eGFR: >60 mL/min/{1.73_m2} (ref >=60)

## 2022-07-01 LAB — ABO/RH: ABO Rh: A POS

## 2022-07-01 LAB — HCG QUANTITATIVE: hCG, Quant.: 138.5 m[IU]/mL

## 2022-07-01 NOTE — ED Triage Notes (Signed)
[facility] EMERGENCY DEPARTMENT  Provider in Triage Note        Patient Name: PORCHIA SINKLER    Chief Complaint:   Chief Complaint   Patient presents with    Vaginal Bleeding-pregnant       HPI: MINNETTA SANDORA is a 26 y.o. female, who has had a rapid medical screening evaluation initiated by myself. Pt is 5 wks pregnancy and notes vaginal bleeding and pelvic pain since yesterday, unsure of how many pads since she is sitting on toilet and bleeding. Pt notes seeing clots. Pt notes she feels faint and dizzy.   LMP May 20, 2022    Medical/Surgical/Social history: as per HPI    Vitals: BP 133/89   Pulse 70   Temp 98.1 F (36.7 C) (Oral)   Resp 18   Ht 5\' 11"  (1.803 m)   Wt 63 kg   LMP 05/20/2022 (Exact Date)   SpO2 100%   BMI 19.39 kg/m     Pertinent brief exam:   Examination of area of concern: Pt is tearful. TTP over right pelvic area.     Preliminary orders: CBC CMP TVUS bhcg ABO/rh UA    Symptom based preliminary diagnosis/MDM: Vaginal bleeding x 1 day    Patient advised to remain in the ED until further evaluation can be performed. Patient instructed to notify staff of any changes in condition while waiting.  This assessment is an initial evaluation to expedite care.

## 2022-07-01 NOTE — ED Triage Notes (Signed)
Shannon Yates is a 26 y.o. female presenting today with c/o RLQ pain, vaginal bleeding, and generalized fatigue. Patient states that she is approximately [redacted] weeks pregnant, LMP 05/20/22. Reports symptoms started approximatley 36 hours ago; around 45 mins ago started feeling faint, dizzy, and feels as if her body is vibrating. Endorsing vaginal bleeding and pelvic pain, passing large clots.     BP 133/89   Pulse 70   Temp 98.1 F (36.7 C) (Oral)   Resp 18   Ht 5\' 11"  (1.803 m)   Wt 63 kg   LMP 05/20/2022 (Exact Date)   SpO2 100%   BMI 19.39 kg/m

## 2022-07-01 NOTE — ED Provider Notes (Signed)
EMERGENCY DEPARTMENT HISTORY AND PHYSICAL EXAM     Event Date/Time    Physician/Midlevel provider first contact with patient: 07/01/22 1722       Date: 07/01/2022   Patient Name: Shannon Yates  Attending Physician: Debbora Lacrosse, MD   Advanced Practice Provider: Lendon Ka, FNP    History of Presenting Illness   Chief Complaint:  Chief Complaint   Patient presents with    Vaginal Bleeding-pregnant     HPI: Shannon Yates is a pleasant 26 y.o. female who  has no past medical history on file. (per Epic records), presents today with concern for vaginal bleeding (G1P0, estimated [redacted] weeks pregnant, reported HCG at [facility] yesterday 206) since yesterday. Patient reports similar to a "moderately heavy" period but unable to quantify via pads because majority of bleeding has been over the toilet. Denies clots but states "thick blood." Reports dull ache lower abdomen cramping. Denies N/V/D. Denies fevers. Reports a history of borderline chronic anemia but denies ever need medications or blood transfusions. Reports mild intermittent dizziness. Denies chest pain or shortness of breath.    Reports blood type A+ and husband states B+.    PCP: Ruben Gottron, NP  Past History   Past Medical History:  History reviewed. No pertinent past medical history.  Past Surgical History:  History reviewed. No pertinent surgical history.  Family/Social History:  She reports that she has never smoked. She has never used smokeless tobacco. She reports that she does not currently use alcohol after a past usage of about 1.0 standard drink of alcohol per week. She reports that she does not use drugs.    History reviewed. No pertinent family history.    Allergies:  No Known Allergies    Listed Medications on Arrival:  Home Medications    No Medications           Physical exam   Pulse 70  BP 133/89  Resp 18  SpO2 100 %  Temp 98.1 F (36.7 C)  Vitals:    07/01/22 1722 07/01/22 1723   BP:  133/89   Pulse:  70   Resp:  18   Temp:  98.1 F  (36.7 C)   TempSrc:  Oral   SpO2:  100%   Weight: 63 kg    Height: 5\' 11"  (1.803 m)        Physical Exam  I have reviewed the triage vital signs.  Const: Well nourished, well developed, appears stated age.  Eyes: PERRL, no conjunctival injection. No discharge.  HENT: NCAT, Neck supple without meningismus.  Nares patent. Throat pink without swelling or exudate. No lymphadenopathy.  CV: RRR, Warm, well-perfused extremities with brisk cap refill and strong peripheral pulses.  RESP: Unlabored respiratory effort. CTAB.  GI: Soft, non-distended. Active bowel sounds. No pain, masses, or organomegaly to palpation.  MSK: No gross deformities appreciated.  Skin: Warm, dry. No rashes.  Neuro: Alert, CNs II-XII grossly intact. Sensation and motor function of extremities grossly intact.  Psych: Appropriate mood and affect.      Diagnostic Study Results   Labs -     Results       Procedure Component Value Units Date/Time    ABO/Rh [[id number]] Collected: 07/01/22 1935    Specimen: Blood Updated: 07/01/22 2024     ABO Rh A POS    Beta HCG Quantitative [[id number]] Collected: 07/01/22 1935     Updated: 07/01/22 2006     hCG, Quant. 138.5 mIU/mL  Comprehensive metabolic panel [[id number]]  (Abnormal) Collected: 07/01/22 1935    Specimen: Blood Updated: 07/01/22 2005     Glucose 92 mg/dL      BUN 08.6 mg/dL      Creatinine 0.8 mg/dL      Sodium 578 mEq/L      Potassium 3.9 mEq/L      Chloride 107 mEq/L      CO2 23 mEq/L      Calcium 10.0 mg/dL      Protein, Total 8.4 g/dL      Albumin 5.0 g/dL      AST (SGOT) 23 U/L      ALT 25 U/L      Alkaline Phosphatase 48 U/L      Bilirubin, Total 0.9 mg/dL      Globulin 3.4 g/dL      Albumin/Globulin Ratio 1.5     Anion Gap 11.0     eGFR >60.0 mL/min/1.73 m2     CBC and differential [[id number]]  (Abnormal) Collected: 07/01/22 1935    Specimen: Blood Updated: 07/01/22 1946     WBC 13.11 x10 3/uL      Hgb 14.7 g/dL      Hematocrit 41.3 %      Platelets 344 x10 3/uL      RBC 5.06 x10 6/uL       MCV 86.4 fL      MCH 29.1 pg      MCHC 33.6 g/dL      RDW 13 %      MPV 8.2 fL      Instrument Absolute Neutrophil Count 9.31 x10 3/uL      Neutrophils 71.0 %      Lymphocytes Automated 18.6 %      Monocytes 8.6 %      Eosinophils Automated 0.7 %      Basophils Automated 0.7 %      Immature Granulocytes 0.4 %      Nucleated RBC 0.0 /100 WBC      Neutrophils Absolute 9.31 x10 3/uL      Lymphocytes Absolute Automated 2.44 x10 3/uL      Monocytes Absolute Automated 1.13 x10 3/uL      Eosinophils Absolute Automated 0.09 x10 3/uL      Basophils Absolute Automated 0.09 x10 3/uL      Immature Granulocytes Absolute 0.05 x10 3/uL      Absolute NRBC 0.00 x10 3/uL             Radiologic Studies -   Radiology Results (24 Hour)       Procedure Component Value Units Date/Time    US OB < 14 Weeks with Wilhelmina Mcardle [[id number]] Collected: 07/01/22 1846    Order Status: Completed Updated: 07/01/22 1849    Narrative:      CLINICAL INDICATION: vag bleed    TECHNIQUE: Transabdominal and transvaginal ultrasound is the pelvis was  performed..     COMPARISON: None.    FINDINGS:  The cervix measures 3.1 cm in length. Nabothian cyst is noted. The  endometrium measures 1.4 cm.  An intrauterine pregnancy is not identified.    There is a small amount of free fluid in the cul-de-sac.    The right ovary measures 3.5 x 2.3 x 2.7 cm. Left ovary measures 3.1 x 1.5  x 2.5 cm.       Impression:         Pregnancy of unknown location. Differential includes nonvisualized live  IUP, completed spontaneous abortion, and nonvisualized  ectopic pregnancy.  Recommend serial beta hCG measurement and follow-up pelvic ultrasound as  clinically indicated.    Ewing Schlein MD, MD  07/01/2022 6:47 PM        .    EKG -    , Normal sinus rhythm  Note: Initial/preliminary ECG reading for STEMI and other critical findings are performed by an Emergency Physician.      Procedures     Procedures:   Procedures    Medical Decision Making & ED Course     I reviewed the vital signs,  available nursing notes, past medical history, past surgical history, family history, social history, and this visit's labs and radiology listed above.    Medications given in the ED:  ED Medication Orders (From admission, onward)      None            Medications Prescribed: (Note: any 600mg  or 800mg  ibuprofen tablets are prescription dosing, not OTC)  Discharge Prescriptions    None         Clinical Decision Support:       Vital Signs-Reviewed the patient's vital signs.   Patient Vitals for the past 12 hrs:   BP Temp Pulse Resp   07/01/22 1723 133/89 98.1 F (36.7 C) 70 18       ED Course:        Provider Notes:    26 y.o. female with history as listed above presents today with inevitable miscarriage.  No intrauterine pregnancy visualized and hCG downtrending per patient hCG reported at an outpatient lab yesterday.  Patient with follow-up with OB/GYN tomorrow to continue monitoring and possible repeat ultrasound as needed.  Patient A positive blood type so RhoGAM not indicated.  CBC within normal limits so no indication for blood needed at this time.  No signs of retained products ultrasound.  Although unlikely cannot rule out ectopic pregnancy.  No other signs of intra-abdominal source to discomforts no other imaging or lab work indicated at this time.  Patient and husband multiple questions time taken to answer all of them regarding appropriate follow-up and return precautions.     Discussed clinical case, plan of care, and disposition with Debbora Lacrosse, MD     Though other pathology possible, pt is without current signs of instability, medical urgency or emergency.  Pt well appearing at discharge.  Pt/caregiver understands possibility of progression of disease, need for continued care and assessment outpatient.  Aftercare and return precautions discussed.    Diagnosis & Disposition     Clinical Impression:   1. Inevitable complete miscarriage without complication        Treatment Plan:   ED Disposition        ED Disposition   Discharge    Condition   --    Date/Time   Thu Jul 01, 2022  8:43 PM    Comment   PENINA REISNER discharge to home/self care.    Condition at disposition: Stable                 Followup: (See discharge instructions if not listed here)     This note was completed using dragon medical speech recognition software. Grammatical errors, random word insertions, pronoun errors, incorrect word insertion, misspellings and incomplete sentences are occasional consequences of this technology due to software limitations. If there are questions or concerns about the content of this note or information contained within the body of this dictation they should be addressed with  the provider for clarification. This chart may have been refreshed after admission or discharge, and therefore some results may not have been available for my review at the time of my involvement with the patient.  _____________________________    CHART OWNERSHIP: I, Lendon Ka, FNP, am the primary clinician of record.       Lendon Ka, FNP  07/01/22 2100       Debbora Lacrosse, MD  07/01/22 2105

## 2022-07-01 NOTE — ED Notes (Signed)
Bed: RE6  Expected date:   Expected time:   Means of arrival:   Comments:

## 2022-07-02 LAB — ECG 12-LEAD
Atrial Rate: 61 {beats}/min
IHS MUSE NARRATIVE AND IMPRESSION: NORMAL
P Axis: -2 degrees
P-R Interval: 176 ms
Q-T Interval: 440 ms
QRS Duration: 106 ms
QTC Calculation (Bezet): 442 ms
R Axis: 88 degrees
T Axis: 56 degrees
Ventricular Rate: 61 {beats}/min

## 2023-04-07 ENCOUNTER — Encounter (HOSPITAL_BASED_OUTPATIENT_CLINIC_OR_DEPARTMENT_OTHER): Payer: Self-pay

## 2023-05-17 ENCOUNTER — Ambulatory Visit
Admission: EM | Admit: 2023-05-17 | Discharge: 2023-05-17 | Disposition: A | Discharge status: Home / Self Care | Payer: 59 | Attending: Urgent Care | Admitting: Urgent Care

## 2023-05-17 DIAGNOSIS — B379 Candidiasis, unspecified: Secondary | ICD-10-CM

## 2023-05-17 DIAGNOSIS — N61 Mastitis without abscess: Secondary | ICD-10-CM | POA: Diagnosis not present

## 2023-05-17 MED ORDER — MICONAZOLE NITRATE 2 % EX CREA
TOPICAL_CREAM | CUTANEOUS | 0 refills | Status: AC
Start: 2023-05-17 — End: ?

## 2023-05-17 MED ORDER — CEPHALEXIN 500 MG PO CAPS
500.0000 mg | ORAL_CAPSULE | Freq: Four times a day (QID) | ORAL | 0 refills | Status: AC
Start: 2023-05-17 — End: 2023-05-27

## 2023-05-17 MED ORDER — MICONAZOLE NITRATE 2 % EX CREA
1.0000 | TOPICAL_CREAM | Freq: Two times a day (BID) | CUTANEOUS | 0 refills | Status: DC
Start: 2023-05-17 — End: 2023-05-17

## 2023-05-17 NOTE — ED Triage Notes (Signed)
Patient presents to [facility] for bilateral breast shooting pain and fever since yesterday. States she is 5 weeks postpartum and had mastitis 2 weeks ago. Treated with antibiotics, states she breastfeeds and baby is having trouble eating. Concerned she may have thrush or mastitis. Taking ibuprofen for pain.

## 2023-05-17 NOTE — Discharge Instructions (Addendum)
Apply a small amount of miconazole cream to the nipple area of your breasts twice a day until symptoms resolve.  If you develop more "hot spots" on your breast indicating mastitis (bacterial infection), fill and start taking cephalexin.

## 2023-05-17 NOTE — ED Provider Notes (Signed)
Carrie Daugherty    CSN: [id number] Arrival date & time: 05/17/23  1823      History   Chief Complaint Chief Complaint  Patient presents with   Breast Pain   Fever    HPI Carrie Daugherty is a 27 y.o. female.   HPI  Presents to urgent care with complaint of "shooting pain" in her bilateral breasts.  Also endorses fever and soreness x 2 weeks.  Patient states she is 5 weeks postpartum and treated for mastitis 2 weeks ago with antibiotics.  She endorses breast-feeding and baby having trouble eating.  Concerned with "thrush" or mastitis.  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 No pertinent surgical history.  OB History   No obstetric history on file.      Home Medications    Prior to Admission medications   Medication Sig Start Date End Date Taking? Authorizing Provider  cephALEXin (KEFLEX) 500 MG capsule Take 1 capsule (500 mg total) by mouth 4 (four) times daily for 10 days. 05/17/23 05/27/23 Yes Simcha Speir, Jeannett Senior, FNP  miconazole (MICOTIN) 2 % cream Apply 1 Application topically 2 (two) times daily. 05/17/23  Yes Ineze Serrao, Jeannett Senior, FNP  diclofenac (VOLTAREN) 75 MG EC tablet Take 1 tablet (75 mg total) by mouth 2 (two) times daily. 07/14/17   CoplandKarleen Hampshire, MD    Family History Family History  Problem Relation Age of Onset   Alcohol abuse Other        paternal great grandfather   Arthritis Father    Arthritis Paternal Grandfather    Heart disease Paternal Grandmother    High blood pressure Mother    High blood pressure Maternal Grandfather    High blood pressure Maternal Grandmother     Social History Social History   Tobacco Use   Smoking  status: Never   Smokeless tobacco: Never  Substance Use Topics   Alcohol use: No    Alcohol/week: 0.0 standard drinks of alcohol   Drug use: No     Allergies   Patient has no known allergies.   Review of Systems Review of Systems   Physical Exam Triage Vital Signs ED Triage Vitals  Enc Vitals Group     BP      Pulse      Resp      Temp      Temp src      SpO2      Weight      Height      Head Circumference      Peak Flow      Pain Score      Pain Loc      Pain Edu?      Excl. in GC?    No data found.  Updated Vital Signs BP 102/71 (BP Location: Left Arm)   Pulse 97   Temp 98.5 F (36.9 C) (Oral)   Resp 16   SpO2 98%   Breastfeeding Yes   Visual Acuity Right Eye Distance:   Left Eye Distance:   Bilateral Distance:    Right Eye Near:   Left Eye Near:    Bilateral Near:     Physical Exam  Vitals reviewed.  Constitutional:      Appearance: Normal appearance. She is not ill-appearing.  Chest:    Skin:    General: Skin is warm.  Neurological:     General: No focal deficit present.     Mental Status: She is alert and oriented to person, place, and time.  Psychiatric:        Mood and Affect: Mood normal.        Behavior: Behavior normal.      UC Treatments / Results  Labs (all labs ordered are listed, but only abnormal results are displayed) Labs Reviewed - No data to display  EKG   Radiology No results found.  Procedures Procedures (including critical care time)  Medications Ordered in UC Medications - No data to display  Initial Impression / Assessment and Plan / UC Course  I have reviewed the triage vital signs and the nursing notes.  Pertinent labs & imaging results that were available during my care of the patient were reviewed by me and considered in my medical decision making (see chart for details).   Carrie Daugherty is a 27 y.o. female presenting with painful breasts. Patient is afebrile without recent antipyretics, satting  well on room air. Overall is well appearing though non-toxic, well hydrated, without respiratory distress.  There is a developing hotspot versus clogged duct at the 12 o'clock position from her left nipple.  Reviewed relevant chart history.   Shooting breast pain is suspicious for candidal infection within the breast.  Her OB/GYN provider had prescribed miconazole topically but she left the prescription at home.  Will represcribe for her and have her start using.  I am somewhat concerned about recurrent mastitis given the developing hotspot on her left breast and report of elevated body temperature.  Will prescribe cephalexin and give a paper prescription for her to fill as needed.  She will fill this if fever worsens as well as if she continues to develop worsening breast pain in the area of the presumed duct clogged.  Counseled patient on potential for adverse effects with medications prescribed/recommended today, ER and return-to-clinic precautions discussed, patient verbalized understanding and agreement with care plan.  Final Clinical Impressions(s) / UC Diagnoses   Final diagnoses:  Candida infection  Mastitis, acute     Discharge Instructions      Apply a small amount of miconazole cream to the nipple area of your breasts twice a day until symptoms resolve.  If you develop more "hot spots" on your breast indicating mastitis (bacterial infection), fill and start taking cephalexin.        ED Prescriptions     Medication Sig Dispense Auth. Provider   miconazole (MICOTIN) 2 % cream Apply 1 Application topically 2 (two) times daily. 28 g Kashawn Manzano, FNP   cephALEXin (KEFLEX) 500 MG capsule Take 1 capsule (500 mg total) by mouth 4 (four) times daily for 10 days. 40 capsule Latrease Kunde, FNP      PDMP not reviewed this encounter.   Charma Igo, [place] 05/17/23 1909
